# Patient Record
Sex: Female | Born: 1960 | Race: White | Hispanic: No | Marital: Married | Smoking: Never smoker
Health system: Southern US, Community
[De-identification: ages and names within clinical notes are randomized; demographics above are authoritative.]

---
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
## 1987-03-18 HISTORY — PX: OTHER SURGICAL HISTORY: SHX169

## 1998-07-10 ENCOUNTER — Other Ambulatory Visit: Admission: RE | Admit: 1998-07-10 | Discharge: 1998-07-10 | Payer: Self-pay | Admitting: Obstetrics and Gynecology

## 2000-10-20 ENCOUNTER — Other Ambulatory Visit: Admission: RE | Admit: 2000-10-20 | Discharge: 2000-10-20 | Payer: Self-pay | Admitting: Obstetrics and Gynecology

## 2001-10-25 ENCOUNTER — Other Ambulatory Visit: Admission: RE | Admit: 2001-10-25 | Discharge: 2001-10-25 | Payer: Self-pay | Admitting: Obstetrics and Gynecology

## 2002-12-05 ENCOUNTER — Other Ambulatory Visit: Admission: RE | Admit: 2002-12-05 | Discharge: 2002-12-05 | Payer: Self-pay | Admitting: Obstetrics and Gynecology

## 2004-09-05 ENCOUNTER — Encounter: Admission: RE | Admit: 2004-09-05 | Discharge: 2004-09-05 | Payer: Self-pay | Admitting: *Deleted

## 2004-11-19 ENCOUNTER — Ambulatory Visit (HOSPITAL_COMMUNITY): Admission: RE | Admit: 2004-11-19 | Discharge: 2004-11-19 | Payer: Self-pay | Admitting: Orthopedic Surgery

## 2004-11-19 ENCOUNTER — Ambulatory Visit (HOSPITAL_BASED_OUTPATIENT_CLINIC_OR_DEPARTMENT_OTHER): Admission: RE | Admit: 2004-11-19 | Discharge: 2004-11-19 | Payer: Self-pay | Admitting: Orthopedic Surgery

## 2005-03-17 HISTORY — PX: ANKLE SURGERY: SHX546

## 2007-07-01 ENCOUNTER — Encounter: Admission: RE | Admit: 2007-07-01 | Discharge: 2007-07-01 | Payer: Self-pay | Admitting: Internal Medicine

## 2007-12-10 ENCOUNTER — Encounter: Admission: RE | Admit: 2007-12-10 | Discharge: 2007-12-10 | Payer: Self-pay | Admitting: Internal Medicine

## 2008-12-11 ENCOUNTER — Encounter: Admission: RE | Admit: 2008-12-11 | Discharge: 2008-12-11 | Payer: Self-pay | Admitting: Internal Medicine

## 2008-12-19 ENCOUNTER — Ambulatory Visit: Payer: Self-pay | Admitting: Family Medicine

## 2009-06-26 ENCOUNTER — Other Ambulatory Visit: Admission: RE | Admit: 2009-06-26 | Discharge: 2009-06-26 | Payer: Self-pay | Admitting: Internal Medicine

## 2010-08-02 NOTE — Op Note (Signed)
Kaitlyn Morris, Kaitlyn Morris              ACCOUNT NO.:  [id number]   MEDICAL RECORD NO.:  [id number]          PATIENT TYPE:  AMB   LOCATION:  DSC                          FACILITY:  [facility]   PHYSICIAN:  Leonides Grills, M.D.     DATE OF BIRTH:  [birth year]   DATE OF PROCEDURE:  11/19/2004  DATE OF DISCHARGE:                                 OPERATIVE REPORT   PREOPERATIVE DIAGNOSES:  1.  Left Achilles tendinopathy.  2.  Left tight gastrocnemius.   POSTOPERATIVE DIAGNOSES:  1.  Left Achilles tendinopathy.  2.  Left tight gastrocnemius.   OPERATIONS:  1.  Left FHL to calcaneus tendon transfer.  2.  Debridement of Achilles tendon.  3.  Left gastrocnemius slide.   ANESTHESIA:  General.   SURGEON:  Leonides Grills, M.D.   ASSISTANTThomasena Edis, PAC.   ESTIMATED BLOOD LOSS:  Minimal.   TOURNIQUET TIME:  Approximately an hour.   COMPLICATIONS:  None.   DISPOSITION:  Stable to the PARU.   INDICATIONS:  This is a 50 year old female who has had longstanding  posterior ankle pain secondary to Achilles tendinopathy.  It was interfering  with her life __________  She was consented for the above procedure.  All  risks which include infection, neurovascular injury, Achilles tendon  rupture, persistent pain, worsening of pain, prolonged recovery, weakness of  the great toe and push off were all explained.  Questions were encouraged  and answered.   OPERATION:  The patient was brought to the operating room and placed in the  supine position initially.  After adequate general endotracheal tube  anesthesia was administered as well as Ancef 1 gram IV piggyback, the  patient was then placed in the sloppy lateral position, operative side down,  and the left lower extremity was then prepped and draped in a sterile manner  over a proximally placed thigh tourniquet.  We started the procedure with a  longitudinal incision over the gastrocnemius medial muscle/tendinous  junction.  Dissection was carried down  through the skin.  Hemostasis was  obtained.  The fascia was opened in line with the incision.  The conjoined  region was then developed between the gastrocnemius and soleus, and soft  tissue was then elevated off the posterior aspect of the gastrocnemius.  Sural nerve was identified and protected posteriorly.  The gastrocnemius  tendon was then released with the curved Mayo scissors.  This had excellent  release of the tight gastrocnemius.  The area was copiously irrigated with  normal saline.  The subcu was closed with 3-0 Vicryl.  The skin was closed  with 4-0 Monocryl subcuticular stitch.  Steri-Strips were applied.   We then gravity exsanguinated the left lower extremity and tourniquet was  elevated to 290 mmHg.  A longitudinal incision was made in the midline  between the posterior aspect of the posterior tibial tendon and the  anteromedial aspect of the Achilles tendon.  Dissection was carried down  through skin.  Hemostasis was obtained.  The Achilles tendon was then  identified and it was debrided.  The soleus unusually was distalized and  left  approximately 2 to 3 cm of Achilles tendon that was involved in the  tendinosis, which did not have a muscle belly.  We then identified the FHL  tendon through deep dissection, protecting the neurovascular structures  medially, and traced the FHL to the sustentaculum tali and then tenotomized  the tendon as distal as possible.  We then prepared the dorsal aspect of the  calcaneal tuber, placed a guide wire and then drilled with an 8-mm  cannulated drill to about a depth of 30 mm.  We then chose an [employer]  tenodesis bioabsorbable screw measuring 8 x 23 mm.  We then placed this on  the application tool with #2 FiberWire and then placed the #2 FiberWire in  the tip of the FHL tendon.  Pulling on the tendon, the muscle belly  distalized beautifully.  We then applied the lasso around the tip of the FHL  tendon, placed this in the canal, and  then placed the tenodesis screw into  the drill hole.  This had excellent purchase and maintenance of the tension  on the FHL tendon.  The muscle belly was distalized completely down to the  calcaneal tuber and was in excellent apposition against the Achilles tendon.  The FHL tendon was then sewn to the Achilles tendon using #2 FiberWire,  completing the transfer and the repair of the FHL tendon to the Achilles  tendon as well.  Then we sewed the FHL muscle belly epimysium to the  paratenon of the Achilles tendon using 4-0 PDS.  Again, this had an  outstanding repair.  The area was copiously irrigated with normal saline.  Tourniquet was deflated.  Hemostasis was obtained.  There was no pulsatile  bleeding.  Subcu was closed with 3-0 Vicryl.  Skin was closed with 4-0  nylon.  Sterile dressing was applied.  Modified Jones dressing was applied.  The patient was stable to the PARU.      Leonides Grills, M.D.  Electronically Signed     PB/MEDQ  D:  11/19/2004  T:  11/19/2004  Job:  [id number]

## 2011-06-20 ENCOUNTER — Other Ambulatory Visit: Payer: Self-pay | Admitting: Internal Medicine

## 2011-06-20 DIAGNOSIS — Z1231 Encounter for screening mammogram for malignant neoplasm of breast: Secondary | ICD-10-CM

## 2011-06-26 ENCOUNTER — Ambulatory Visit
Admission: RE | Admit: 2011-06-26 | Discharge: 2011-06-26 | Disposition: A | Discharge status: Home / Self Care | Payer: BC Managed Care – PPO | Source: Ambulatory Visit | Attending: Internal Medicine | Admitting: Internal Medicine

## 2011-06-26 DIAGNOSIS — Z1231 Encounter for screening mammogram for malignant neoplasm of breast: Secondary | ICD-10-CM

## 2011-06-30 ENCOUNTER — Other Ambulatory Visit: Payer: Self-pay | Admitting: Internal Medicine

## 2011-06-30 DIAGNOSIS — R928 Other abnormal and inconclusive findings on diagnostic imaging of breast: Secondary | ICD-10-CM

## 2011-07-01 ENCOUNTER — Other Ambulatory Visit: Payer: Self-pay | Admitting: Internal Medicine

## 2011-07-01 ENCOUNTER — Ambulatory Visit
Admission: RE | Admit: 2011-07-01 | Discharge: 2011-07-01 | Disposition: A | Discharge status: Home / Self Care | Payer: BC Managed Care – PPO | Source: Ambulatory Visit | Attending: Internal Medicine | Admitting: Internal Medicine

## 2011-07-01 ENCOUNTER — Other Ambulatory Visit (HOSPITAL_COMMUNITY)
Admission: RE | Admit: 2011-07-01 | Discharge: 2011-07-01 | Disposition: A | Discharge status: Home / Self Care | Payer: BC Managed Care – PPO | Source: Ambulatory Visit | Attending: Internal Medicine | Admitting: Internal Medicine

## 2011-07-01 DIAGNOSIS — Z1159 Encounter for screening for other viral diseases: Secondary | ICD-10-CM | POA: Insufficient documentation

## 2011-07-01 DIAGNOSIS — R928 Other abnormal and inconclusive findings on diagnostic imaging of breast: Secondary | ICD-10-CM

## 2011-07-01 DIAGNOSIS — Z01419 Encounter for gynecological examination (general) (routine) without abnormal findings: Secondary | ICD-10-CM | POA: Insufficient documentation

## 2013-03-23 ENCOUNTER — Other Ambulatory Visit: Payer: Self-pay

## 2013-03-23 DIAGNOSIS — Z1231 Encounter for screening mammogram for malignant neoplasm of breast: Secondary | ICD-10-CM

## 2013-03-29 ENCOUNTER — Ambulatory Visit
Admission: RE | Admit: 2013-03-29 | Discharge: 2013-03-29 | Disposition: A | Discharge status: Home / Self Care | Payer: BC Managed Care – PPO | Source: Ambulatory Visit

## 2013-03-29 DIAGNOSIS — Z1231 Encounter for screening mammogram for malignant neoplasm of breast: Secondary | ICD-10-CM

## 2015-01-11 ENCOUNTER — Other Ambulatory Visit (HOSPITAL_COMMUNITY)
Admission: RE | Admit: 2015-01-11 | Discharge: 2015-01-11 | Disposition: A | Discharge status: Home / Self Care | Payer: BLUE CROSS/BLUE SHIELD | Source: Ambulatory Visit | Attending: Internal Medicine | Admitting: Internal Medicine

## 2015-01-11 ENCOUNTER — Other Ambulatory Visit: Payer: Self-pay | Admitting: Internal Medicine

## 2015-01-11 DIAGNOSIS — Z01419 Encounter for gynecological examination (general) (routine) without abnormal findings: Secondary | ICD-10-CM | POA: Diagnosis present

## 2015-01-11 DIAGNOSIS — Z1151 Encounter for screening for human papillomavirus (HPV): Secondary | ICD-10-CM | POA: Insufficient documentation

## 2015-01-16 LAB — CYTOLOGY - PAP

## 2019-06-03 ENCOUNTER — Ambulatory Visit: Payer: BLUE CROSS/BLUE SHIELD | Attending: Internal Medicine

## 2019-06-03 DIAGNOSIS — Z23 Encounter for immunization: Secondary | ICD-10-CM

## 2019-06-03 NOTE — Progress Notes (Signed)
   Covid-19 Vaccination Clinic  Name:  Kaitlyn Morris    MRN: [id number] DOB: 04-03-1960  06/03/2019  Kaitlyn Morris was observed post Covid-19 immunization for 15 minutes without incident. She was provided with Vaccine Information Sheet and instruction to access the V-Safe system.   Kaitlyn Morris was instructed to call 911 with any severe reactions post vaccine: Marland Kitchen Difficulty breathing  . Swelling of face and throat  . A fast heartbeat  . A bad rash all over body  . Dizziness and weakness   Immunizations Administered    Name Date Dose VIS Date Route   Pfizer COVID-19 Vaccine 06/03/2019 10:42 AM 0.3 mL 02/25/2019 Intramuscular   Manufacturer: [employer], [employer]   Lot: [id number]   NDC: [address]-7

## 2019-06-29 ENCOUNTER — Ambulatory Visit: Payer: BLUE CROSS/BLUE SHIELD | Attending: Internal Medicine

## 2019-06-29 DIAGNOSIS — Z23 Encounter for immunization: Secondary | ICD-10-CM

## 2019-06-29 NOTE — Progress Notes (Signed)
   Covid-19 Vaccination Clinic  Name:  Kaitlyn Morris    MRN: [id number] DOB: 05-Mar-1961  06/29/2019  [name] was observed post Covid-19 immunization for 15 minutes without incident. She was provided with Vaccine Information Sheet and instruction to access the V-Safe system.   [name] was instructed to call 911 with any severe reactions post vaccine: Marland Kitchen Difficulty breathing  . Swelling of face and throat  . A fast heartbeat  . A bad rash all over body  . Dizziness and weakness   Immunizations Administered    Name Date Dose VIS Date Route   Pfizer COVID-19 Vaccine 06/29/2019 10:21 AM 0.3 mL 02/25/2019 Intramuscular   Manufacturer: [employer], [employer]   Lot: [id number]   NDC: [address]-8

## 2020-07-19 ENCOUNTER — Ambulatory Visit (INDEPENDENT_AMBULATORY_CARE_PROVIDER_SITE_OTHER): Payer: Self-pay | Admitting: Physician Assistant

## 2020-07-19 ENCOUNTER — Encounter: Payer: Self-pay | Admitting: Physician Assistant

## 2020-07-19 ENCOUNTER — Other Ambulatory Visit: Payer: Self-pay

## 2020-07-19 VITALS — BP 135/83 | HR 77 | Temp 98.2°F | Ht 63.0 in | Wt 207.3 lb

## 2020-07-19 DIAGNOSIS — Z7689 Persons encountering health services in other specified circumstances: Secondary | ICD-10-CM

## 2020-07-19 DIAGNOSIS — R030 Elevated blood-pressure reading, without diagnosis of hypertension: Secondary | ICD-10-CM

## 2020-07-19 DIAGNOSIS — Z0000 Encounter for general adult medical examination without abnormal findings: Secondary | ICD-10-CM

## 2020-07-19 NOTE — Patient Instructions (Addendum)
New Chapter One Daily Woman's Multivitamin   Preventing Hypertension Hypertension, also called high blood pressure, is when the force of blood pumping through the arteries is too strong. Arteries are blood vessels that carry blood from the heart throughout the body. Often, hypertension does not cause symptoms until blood pressure is very high. It is important to have your blood pressure checked regularly. Diet and lifestyle changes can help you prevent hypertension, and they may make you feel better overall and improve your quality of life. If you already have hypertension, you may control it with diet and lifestyle changes, as well as with medicine. How can this condition affect me? Over time, hypertension can damage the arteries and decrease blood flow to important parts of the body, including the brain, heart, and kidneys. By keeping your blood pressure in a healthy range, you can help prevent complications like heart attack, heart failure, stroke, kidney failure, and vascular dementia. What can increase my risk?  Being an older adult. Older people are more often affected.  Having family members who have had high blood pressure.  Being obese.  Being female. Males are more likely to have high blood pressure.  Drinking too much alcohol or caffeine.  Smoking or using illegal drugs.  Taking certain medicines, such as antidepressants, decongestants, birth control pills, and NSAIDs, such as ibuprofen.  Having thyroid problems.  Having certain tumors. What actions can I take to prevent or manage this condition? Work with your health care provider to make a hypertension prevention plan that works for you. Follow your plan and keep all follow-up visits as told by your health care provider. Diet changes Maintain a healthy diet. This includes:  Eating less salt (sodium). Ask your health care provider how much sodium is safe for you to have. The general recommendation is to have less than 1 tsp  (2,300 mg) of sodium a day. ? Do not add salt to your food. ? Choose low-sodium options when grocery shopping and eating out.  Limiting fats in your diet. You can do this by eating low-fat or fat-free dairy products and by eating less red meat.  Eating more fruits, vegetables, and whole grains. Make a goal to eat: ? 1-2 cups of fresh fruits and vegetables each day. ? 3-4 servings of whole grains each day.  Avoiding foods and beverages that have added sugars.  Eating fish that contain healthy fats (omega-3 fatty acids), such as mackerel or salmon. If you need help putting together a healthy eating plan, try the DASH diet. This diet is high in fruits, vegetables, and whole grains. It is low in sodium, red meat, and added sugars. DASH stands for Dietary Approaches to Stop Hypertension.   Lifestyle changes Lose weight if you are overweight. Losing just 3?5% of your body weight can help prevent or control hypertension. For example, if your present weight is 200 lb (91 kg), a loss of 3-5% of your weight means losing 6-10 lb (2.7-4.5 kg). Ask your health care provider to help you with a diet and exercise plan to safely lose weight. Other recommendations usually include:  Get enough exercise. Do at least 150 minutes of moderate-intensity exercise each week. You could do this in short exercise sessions several times a day, or you could do longer exercise sessions a few times a week. For example, you could take a brisk 10-minute walk or bike ride, 3 times a day, for 5 days a week.  Find ways to reduce stress, such as exercising, meditating,  listening to music, or taking a yoga class. If you need help reducing stress, ask your health care provider.  Do not use any products that contain nicotine or tobacco, such as cigarettes, e-cigarettes, and chewing tobacco. If you need help quitting, ask your health care provider. Chemicals in tobacco and nicotine products raise your blood pressure each time you use  them. If you need help quitting, ask your health care provider.  Learn how to check your blood pressure at home. Make sure that you know your personal target blood pressure, as told by your health care provider.  Try to sleep 7-9 hours per night.   Alcohol use  Do not drink alcohol if: ? Your health care provider tells you not to drink. ? You are pregnant, may be pregnant, or are planning to become pregnant.  If you drink alcohol: ? Limit how much you use to:  0-1 drink a day for women.  0-2 drinks a day for men. ? Be aware of how much alcohol is in your drink. In the [place] one drink equals one 12 oz bottle of beer (355 mL), one 5 oz glass of wine (148 mL), or one 1 oz glass of hard liquor (44 mL). Medicines In addition to diet and lifestyle changes, your health care provider may recommend medicines to help lower your blood pressure. In general:  You may need to try a few different medicines to find what works best for you.  You may need to take more than one medicine.  Take over-the-counter and prescription medicines only as told by your health care provider. Questions to ask your health care provider  What is my blood pressure goal?  How can I lower my risk for high blood pressure?  How should I monitor my blood pressure at home? Where to find support Your health care provider can help you prevent hypertension and help you keep your blood pressure at a healthy level. Your local hospital or your community may also provide support services and prevention programs. The American Heart Association offers an online support network at supportnetwork.heart.org Where to find more information Learn more about hypertension from:  National Heart, Lung, and Blood Institute: PopSteam.is  Centers for Disease Control and Prevention: FootballExhibition.com.br  American Academy of Family Physicians: familydoctor.org Learn more about the DASH diet from:  National Heart, Lung, and Blood  Institute: PopSteam.is Contact a health care provider if:  You think you are having a reaction to medicines you have taken.  You have recurrent headaches or feel dizzy.  You have swelling in your ankles.  You have trouble with your vision. Get help right away if:  You have sudden, severe chest, back, or abdominal pain or discomfort.  You have shortness of breath.  You have a sudden, severe headache. These symptoms may represent a serious problem that is an emergency. Do not wait to see if the symptoms will go away. Get medical help right away. Call your local emergency services (911 in the [place] Do not drive yourself to the hospital.  Summary  Hypertension often does not cause any symptoms until blood pressure is very high. It is important to get your blood pressure checked regularly.  Diet and lifestyle changes are important steps in preventing hypertension.  By keeping your blood pressure in a healthy range, you may prevent complications like heart attack, heart failure, stroke, and kidney failure.  Work with your health care provider to make a hypertension prevention plan that works for you. This information  is not intended to replace advice given to you by your health care provider. Make sure you discuss any questions you have with your health care provider. Document Revised: 02/01/2019 Document Reviewed: 02/01/2019 Elsevier Patient Education  2021 [employer].

## 2020-07-19 NOTE — Progress Notes (Signed)
New Patient Office Visit  Subjective:  Patient ID: Kaitlyn Morris, female    DOB: 10-10-1960  Age: 60 y.o. MRN: [id number]  CC:  Chief Complaint  Patient presents with  . New Patient (Initial Visit)    HPI Kaitlyn Morris presents to establish care. G0P0. Patient reports it has been several years since she has been to a PCP.  No pertinent past medical history.  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  Patient reports is not taking supplements, prescribed or over-the-counter medication.  No pertinent family history.  Patient reports has never been a smoker, does not drink alcohol or use illicit drugs.  Patient drinks about one half-one cup of tea per day. Walks twice daily.  Reports has been under significant stress recently with the sickness of her dog and has been busy with work.  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  Patient denies any prior history of hypertension.  Denies chest pain, palpitations, dizziness, headache or edema.  History reviewed. No pertinent past medical history.  Past Surgical History:  Procedure Laterality Date  . ANKLE SURGERY  2007  . REPLACEMENT TOTAL KNEE Right 1980  . WRIST FUSION  1989    History reviewed. No pertinent family history.  Social History   Socioeconomic History  . Marital status: Married    Spouse name: Halina Asano  . Number of children: Not on file  . Years of education: Not on file  . Highest education level: Not on file  Occupational History  . Not on file  Tobacco Use  . Smoking status: Never Smoker  . Smokeless tobacco: Never Used  Vaping Use  . Vaping Use: Never used  Substance and Sexual Activity  . Alcohol use: Not Currently  . Drug use: Never  . Sexual activity: Yes    Birth control/protection: None  Other Topics Concern  . Not on file  Social History Narrative  . Not on file    Social Determinants of Health   Financial Resource Strain: Not on file  Food Insecurity: Not on file  Transportation Needs: Not on file  Physical Activity: Not on file  Stress: Not on file  Social Connections: Not on file  Intimate Partner Violence: Not on file    ROS Review of Systems A fourteen system review of systems was performed and found to be positive as per HPI.  Objective:   Today's Vitals: BP 135/83   Pulse 77   Temp 98.2 F (36.8 C)   Ht 5\' 3"  (1.6 m)   Wt 207 lb 4.8 oz (94 kg)   SpO2 97%   BMI 36.72 kg/m   Physical Exam General:  Well Developed, well nourished, appropriate for stated age.  Neuro:  Alert and oriented,  extra-ocular muscles intact  HEENT:  Normocephalic, atraumatic, neck supple Skin:  no gross rash, warm, pink. Cardiac:  RRR, S1 S2 wnl's, no murmur  Respiratory:  ECTA B/L w/o wheezing, Not using accessory muscles, speaking in full sentences- unlabored. Vascular:  Ext warm, no cyanosis apprec.; cap RF less 2 sec. Psych:  No HI/SI, judgement and insight good, Euthymic mood. Full Affect.   Assessment & Plan:   Problem List Items Addressed This Visit   None   Visit Diagnoses    Encounter to establish care    -  Primary   Elevated blood pressure reading without diagnosis  of hypertension       Healthcare maintenance         Healthcare Maintenance: -Recommend to schedule CPE and fasting blood work. -Patient reports had a colonoscopy about 8 years ago, ordered by Dr. Kirby Funk.  Will request records. -Recommend to stay hydrated, continue low caffeine consumption and continue walking regimen with ultimate goal of 150 minutes/wk of moderate physical activity.  Elevated blood pressure reading without diagnosis of hypertension: -Initial BP elevated, BP recheck improved and stable. Patient asymptomatic. -Recommend stress reduction techniques. -Will continue to monitor.   -Will repeat blood pressure at follow-up visit.  No outpatient  encounter medications on file as of 07/19/2020.   No facility-administered encounter medications on file as of 07/19/2020.    Follow-up: Return for CPE and FBW few days prior in 2-3 weeks.   Mayer Masker, PA-C

## 2020-07-31 ENCOUNTER — Other Ambulatory Visit: Payer: Self-pay | Admitting: Physician Assistant

## 2020-07-31 DIAGNOSIS — Z0000 Encounter for general adult medical examination without abnormal findings: Secondary | ICD-10-CM

## 2020-08-03 ENCOUNTER — Other Ambulatory Visit: Payer: Self-pay

## 2020-08-03 DIAGNOSIS — Z0000 Encounter for general adult medical examination without abnormal findings: Secondary | ICD-10-CM

## 2020-08-04 LAB — COMPREHENSIVE METABOLIC PANEL
ALT: 18 IU/L (ref 0–32)
AST: 12 IU/L (ref 0–40)
Albumin/Globulin Ratio: 1.4 (ref 1.2–2.2)
Albumin: 4.3 g/dL (ref 3.8–4.9)
Alkaline Phosphatase: 71 IU/L (ref 44–121)
BUN/Creatinine Ratio: 23 (ref 9–23)
BUN: 19 mg/dL (ref 6–24)
Bilirubin Total: 0.3 mg/dL (ref 0.0–1.2)
CO2: 23 mmol/L (ref 20–29)
Calcium: 9.5 mg/dL (ref 8.7–10.2)
Chloride: 100 mmol/L (ref 96–106)
Creatinine, Ser: 0.84 mg/dL (ref 0.57–1.00)
Globulin, Total: 3.1 g/dL (ref 1.5–4.5)
Glucose: 94 mg/dL (ref 65–99)
Potassium: 4.6 mmol/L (ref 3.5–5.2)
Sodium: 138 mmol/L (ref 134–144)
Total Protein: 7.4 g/dL (ref 6.0–8.5)
eGFR: 80 mL/min/{1.73_m2} (ref >59)

## 2020-08-04 LAB — HEMOGLOBIN A1C
Est. average glucose Bld gHb Est-mCnc: 120 mg/dL
Hgb A1c MFr Bld: 5.8 % — ABNORMAL HIGH (ref 4.8–5.6)

## 2020-08-04 LAB — CBC
Hematocrit: 44.8 % (ref 34.0–46.6)
Hemoglobin: 14.5 g/dL (ref 11.1–15.9)
MCH: 29.7 pg (ref 26.6–33.0)
MCHC: 32.4 g/dL (ref 31.5–35.7)
MCV: 92 fL (ref 79–97)
Platelets: 292 10*3/uL (ref 150–450)
RBC: 4.89 x10E6/uL (ref 3.77–5.28)
RDW: 12.9 % (ref 11.7–15.4)
WBC: 6.3 10*3/uL (ref 3.4–10.8)

## 2020-08-04 LAB — LIPID PANEL
Chol/HDL Ratio: 3.5 ratio (ref 0.0–4.4)
Cholesterol, Total: 189 mg/dL (ref 100–199)
HDL: 54 mg/dL (ref >39)
LDL Chol Calc (NIH): 109 mg/dL — ABNORMAL HIGH (ref 0–99)
Triglycerides: 148 mg/dL (ref 0–149)
VLDL Cholesterol Cal: 26 mg/dL (ref 5–40)

## 2020-08-04 LAB — TSH: TSH: 2.14 u[IU]/mL (ref 0.450–4.500)

## 2020-08-07 ENCOUNTER — Encounter: Payer: Self-pay | Admitting: Physician Assistant

## 2020-08-07 ENCOUNTER — Ambulatory Visit (INDEPENDENT_AMBULATORY_CARE_PROVIDER_SITE_OTHER): Payer: Self-pay | Admitting: Physician Assistant

## 2020-08-07 ENCOUNTER — Other Ambulatory Visit: Payer: Self-pay

## 2020-08-07 ENCOUNTER — Other Ambulatory Visit (HOSPITAL_COMMUNITY)
Admission: RE | Admit: 2020-08-07 | Discharge: 2020-08-07 | Disposition: A | Discharge status: Home / Self Care | Payer: BLUE CROSS/BLUE SHIELD | Source: Ambulatory Visit | Attending: Physician Assistant | Admitting: Physician Assistant

## 2020-08-07 VITALS — BP 132/82 | HR 79 | Temp 99.1°F | Ht 63.0 in | Wt 206.7 lb

## 2020-08-07 DIAGNOSIS — Z1231 Encounter for screening mammogram for malignant neoplasm of breast: Secondary | ICD-10-CM

## 2020-08-07 DIAGNOSIS — R7303 Prediabetes: Secondary | ICD-10-CM

## 2020-08-07 DIAGNOSIS — Z124 Encounter for screening for malignant neoplasm of cervix: Secondary | ICD-10-CM | POA: Insufficient documentation

## 2020-08-07 DIAGNOSIS — E7800 Pure hypercholesterolemia, unspecified: Secondary | ICD-10-CM

## 2020-08-07 DIAGNOSIS — Z0000 Encounter for general adult medical examination without abnormal findings: Secondary | ICD-10-CM

## 2020-08-07 DIAGNOSIS — Z6836 Body mass index (BMI) 36.0-36.9, adult: Secondary | ICD-10-CM

## 2020-08-07 DIAGNOSIS — Z1239 Encounter for other screening for malignant neoplasm of breast: Secondary | ICD-10-CM

## 2020-08-07 NOTE — Patient Instructions (Signed)
Preventive Care 60-60 Years Old, Female Preventive care refers to lifestyle choices and visits with your health care provider that can promote health and wellness. This includes:  A yearly physical exam. This is also called an annual wellness visit.  Regular dental and eye exams.  Immunizations.  Screening for certain conditions.  Healthy lifestyle choices, such as: ? Eating a healthy diet. ? Getting regular exercise. ? Not using drugs or products that contain nicotine and tobacco. ? Limiting alcohol use. What can I expect for my preventive care visit? Physical exam Your health care provider will check your:  Height and weight. These may be used to calculate your BMI (body mass index). BMI is a measurement that tells if you are at a healthy weight.  Heart rate and blood pressure.  Body temperature.  Skin for abnormal spots. Counseling Your health care provider may ask you questions about your:  Past medical problems.  Family's medical history.  Alcohol, tobacco, and drug use.  Emotional well-being.  Home life and relationship well-being.  Sexual activity.  Diet, exercise, and sleep habits.  Work and work Statistician.  Access to firearms.  Method of birth control.  Menstrual cycle.  Pregnancy history. What immunizations do I need? Vaccines are usually given at various ages, according to a schedule. Your health care provider will recommend vaccines for you based on your age, medical history, and lifestyle or other factors, such as travel or where you work.   What tests do I need? Blood tests  Lipid and cholesterol levels. These may be checked every 5 years, or more often if you are over 60 years old.  Hepatitis C test.  Hepatitis B test. Screening  Lung cancer screening. You may have this screening every year starting at age 60 if you have a 30-pack-year history of smoking and currently smoke or have quit within the past 15 years.  Colorectal cancer  screening. ? All adults should have this screening starting at age 60 and continuing until age 17. ? Your health care provider may recommend screening at age 60 if you are at increased risk. ? You will have tests every 1-10 years, depending on your results and the type of screening test.  Diabetes screening. ? This is done by checking your blood sugar (glucose) after you have not eaten for a while (fasting). ? You may have this done every 1-3 years.  Mammogram. ? This may be done every 1-2 years. ? Talk with your health care provider about when you should start having regular mammograms. This may depend on whether you have a family history of breast cancer.  BRCA-related cancer screening. This may be done if you have a family history of breast, ovarian, tubal, or peritoneal cancers.  Pelvic exam and Pap test. ? This may be done every 3 years starting at age 10. ? Starting at age 11, this may be done every 5 years if you have a Pap test in combination with an HPV test. Other tests  STD (sexually transmitted disease) testing, if you are at risk.  Bone density scan. This is done to screen for osteoporosis. You may have this scan if you are at high risk for osteoporosis. Talk with your health care provider about your test results, treatment options, and if necessary, the need for more tests. Follow these instructions at home: Eating and drinking  Eat a diet that includes fresh fruits and vegetables, whole grains, lean protein, and low-fat dairy products.  Take vitamin and mineral supplements  as recommended by your health care provider.  Do not drink alcohol if: ? Your health care provider tells you not to drink. ? You are pregnant, may be pregnant, or are planning to become pregnant.  If you drink alcohol: ? Limit how much you have to 0-1 drink a day. ? Be aware of how much alcohol is in your drink. In the [place] one drink equals one 12 oz bottle of beer (355 mL), one 5 oz glass of  wine (148 mL), or one 1 oz glass of hard liquor (44 mL).   Lifestyle  Take daily care of your teeth and gums. Brush your teeth every morning and night with fluoride toothpaste. Floss one time each day.  Stay active. Exercise for at least 30 minutes 5 or more days each week.  Do not use any products that contain nicotine or tobacco, such as cigarettes, e-cigarettes, and chewing tobacco. If you need help quitting, ask your health care provider.  Do not use drugs.  If you are sexually active, practice safe sex. Use a condom or other form of protection to prevent STIs (sexually transmitted infections).  If you do not wish to become pregnant, use a form of birth control. If you plan to become pregnant, see your health care provider for a prepregnancy visit.  If told by your health care provider, take low-dose aspirin daily starting at age 50.  Find healthy ways to cope with stress, such as: ? Meditation, yoga, or listening to music. ? Journaling. ? Talking to a trusted person. ? Spending time with friends and family. Safety  Always wear your seat belt while driving or riding in a vehicle.  Do not drive: ? If you have been drinking alcohol. Do not ride with someone who has been drinking. ? When you are tired or distracted. ? While texting.  Wear a helmet and other protective equipment during sports activities.  If you have firearms in your house, make sure you follow all gun safety procedures. What's next?  Visit your health care provider once a year for an annual wellness visit.  Ask your health care provider how often you should have your eyes and teeth checked.  Stay up to date on all vaccines. This information is not intended to replace advice given to you by your health care provider. Make sure you discuss any questions you have with your health care provider. Document Revised: 12/06/2019 Document Reviewed: 11/12/2017 Elsevier Patient Education  2021 Elsevier Inc.  

## 2020-08-07 NOTE — Progress Notes (Signed)
Subjective:     Kaitlyn Morris is a 60 y.o. female and is here for a comprehensive physical exam. The patient reports no problems.  Social History   Socioeconomic History  . Marital status: Married    Spouse name: Jing Howatt  . Number of children: Not on file  . Years of education: Not on file  . Highest education level: Not on file  Occupational History  . Not on file  Tobacco Use  . Smoking status: Never Smoker  . Smokeless tobacco: Never Used  Vaping Use  . Vaping Use: Never used  Substance and Sexual Activity  . Alcohol use: Not Currently  . Drug use: Never  . Sexual activity: Yes    Birth control/protection: None  Other Topics Concern  . Not on file  Social History Narrative  . Not on file   Social Determinants of Health   Financial Resource Strain: Not on file  Food Insecurity: Not on file  Transportation Needs: Not on file  Physical Activity: Not on file  Stress: Not on file  Social Connections: Not on file  Intimate Partner Violence: Not on file   Health Maintenance  Topic Date Due  . HIV Screening  Never done  . Hepatitis C Screening  Never done  . TETANUS/TDAP  Never done  . COLONOSCOPY (Pts 45-92yrs Insurance coverage will need to be confirmed)  Never done  . Zoster Vaccines- Shingrix (1 of 2) Never done  . MAMMOGRAM  03/30/2015  . INFLUENZA VACCINE  10/15/2020  . PAP SMEAR-Modifier  08/08/2023  . COVID-19 Vaccine  Completed  . HPV VACCINES  Aged Out    The following portions of the patient's history were reviewed and updated as appropriate: allergies, current medications, past family history, past medical history, past social history, past surgical history and problem list.  Review of Systems Pertinent items noted in HPI and remainder of comprehensive ROS otherwise negative.   Objective:    BP 132/82   Pulse 79   Temp 99.1 F (37.3 C)   Ht 5\' 3"  (1.6 m)   Wt 206 lb 11.2 oz (93.8 kg)   SpO2 98%   BMI 36.62 kg/m  General appearance:  alert, cooperative and no distress Head: Normocephalic, without obvious abnormality, atraumatic Eyes: conjunctivae/corneas clear. PERRL, EOM's intact. Fundi benign. Ears: normal TM's and external ear canals both ears Nose: Nares normal. Septum midline. Mucosa normal. No drainage or sinus tenderness. Throat: lips, mucosa, and tongue normal; teeth and gums normal Neck: no adenopathy, no carotid bruit, no JVD, supple, symmetrical, trachea midline and thyroid not enlarged, symmetric, no tenderness/mass/nodules Back: symmetric, no curvature. ROM normal. No CVA tenderness. Lungs: clear to auscultation bilaterally Breasts: normal appearance, no masses or tenderness, dense breast tissue noted Heart: regular rate and rhythm, S1, S2 normal, no murmur, click, rub or gallop Abdomen: soft, non-tender; bowel sounds normal; no masses,  no organomegaly Pelvic: cervix normal in appearance, external genitalia normal, no adnexal masses or tenderness, no cervical motion tenderness, positive findings: vaginal atrophy, uterus normal size, shape, and consistency and vagina normal without discharge Extremities: extremities normal, atraumatic, no cyanosis or edema Pulses: 2+ and symmetric Skin: Skin color, texture, turgor normal. No rashes or lesions Lymph nodes: Cervical, supraclavicular, and axillary nodes normal. Neurologic: Grossly normal    Assessment:    Healthy female exam.     Plan:  -Will place order for mammogram.  Will notify of Pap results once available. -Patient declined colonoscopy, hep C and HIV screenings and  immunizations. -Discussed most recent lab results which are essentially within normal limits with the exception of A1c and lipid panel which are mildly elevated.  Recommend to follow a heart healthy diet, reduce carbohydrates and glucose intake and increase physical activity.  Encouraged weight loss with dietary and lifestyle changes. -BMI 36.62: Associated with prediabetes and  dyslipidemia. -Stay well-hydrated. -Follow-up in 6 months for pre-DM, elevated LDL and FBW (lipid panel, CMP, A1c) few days prior.   See After Visit Summary for Counseling Recommendations

## 2020-08-09 LAB — CYTOLOGY - PAP
Comment: NEGATIVE
Diagnosis: NEGATIVE
High risk HPV: NEGATIVE

## 2020-10-30 ENCOUNTER — Other Ambulatory Visit: Payer: Self-pay

## 2020-10-30 ENCOUNTER — Ambulatory Visit
Admission: RE | Admit: 2020-10-30 | Discharge: 2020-10-30 | Disposition: A | Discharge status: Home / Self Care | Payer: No Typology Code available for payment source | Source: Ambulatory Visit | Attending: Physician Assistant | Admitting: Physician Assistant

## 2020-10-30 ENCOUNTER — Other Ambulatory Visit: Payer: Self-pay | Admitting: Physician Assistant

## 2020-10-30 DIAGNOSIS — Z1231 Encounter for screening mammogram for malignant neoplasm of breast: Secondary | ICD-10-CM

## 2021-02-04 ENCOUNTER — Other Ambulatory Visit: Payer: BLUE CROSS/BLUE SHIELD

## 2021-02-06 ENCOUNTER — Ambulatory Visit: Payer: BLUE CROSS/BLUE SHIELD | Admitting: Physician Assistant

## 2022-08-20 ENCOUNTER — Other Ambulatory Visit: Payer: No Typology Code available for payment source

## 2022-08-20 DIAGNOSIS — Z0000 Encounter for general adult medical examination without abnormal findings: Secondary | ICD-10-CM

## 2022-08-20 DIAGNOSIS — I159 Secondary hypertension, unspecified: Secondary | ICD-10-CM

## 2022-08-21 LAB — CBC WITH DIFFERENTIAL/PLATELET
Basophils Absolute: 0 10*3/uL (ref 0.0–0.2)
Basos: 0 %
EOS (ABSOLUTE): 0.2 10*3/uL (ref 0.0–0.4)
Eos: 2 %
Hematocrit: 45.6 % (ref 34.0–46.6)
Hemoglobin: 15.1 g/dL (ref 11.1–15.9)
Immature Grans (Abs): 0 10*3/uL (ref 0.0–0.1)
Immature Granulocytes: 0 %
Lymphocytes Absolute: 2.4 10*3/uL (ref 0.7–3.1)
Lymphs: 33 %
MCH: 29 pg (ref 26.6–33.0)
MCHC: 33.1 g/dL (ref 31.5–35.7)
MCV: 88 fL (ref 79–97)
Monocytes Absolute: 0.5 10*3/uL (ref 0.1–0.9)
Monocytes: 7 %
Neutrophils Absolute: 4.1 10*3/uL (ref 1.4–7.0)
Neutrophils: 58 %
Platelets: 304 10*3/uL (ref 150–450)
RBC: 5.21 x10E6/uL (ref 3.77–5.28)
RDW: 13.6 % (ref 11.7–15.4)
WBC: 7.2 10*3/uL (ref 3.4–10.8)

## 2022-08-21 LAB — HEMOGLOBIN A1C
Est. average glucose Bld gHb Est-mCnc: 131 mg/dL
Hgb A1c MFr Bld: 6.2 % — ABNORMAL HIGH (ref 4.8–5.6)

## 2022-08-21 LAB — TSH: TSH: 2.17 u[IU]/mL (ref 0.450–4.500)

## 2022-08-21 LAB — COMPREHENSIVE METABOLIC PANEL
ALT: 28 IU/L (ref 0–32)
AST: 16 IU/L (ref 0–40)
Albumin/Globulin Ratio: 1.5 (ref 1.2–2.2)
Albumin: 4.6 g/dL (ref 3.9–4.9)
Alkaline Phosphatase: 85 IU/L (ref 44–121)
BUN/Creatinine Ratio: 20 (ref 12–28)
BUN: 18 mg/dL (ref 8–27)
Bilirubin Total: 0.4 mg/dL (ref 0.0–1.2)
CO2: 19 mmol/L — ABNORMAL LOW (ref 20–29)
Calcium: 9.3 mg/dL (ref 8.7–10.3)
Chloride: 104 mmol/L (ref 96–106)
Creatinine, Ser: 0.89 mg/dL (ref 0.57–1.00)
Globulin, Total: 3 g/dL (ref 1.5–4.5)
Glucose: 100 mg/dL — ABNORMAL HIGH (ref 70–99)
Potassium: 4.8 mmol/L (ref 3.5–5.2)
Sodium: 140 mmol/L (ref 134–144)
Total Protein: 7.6 g/dL (ref 6.0–8.5)
eGFR: 74 mL/min/{1.73_m2} (ref >59)

## 2022-08-21 LAB — LIPID PANEL
Chol/HDL Ratio: 4.2 ratio (ref 0.0–4.4)
Cholesterol, Total: 218 mg/dL — ABNORMAL HIGH (ref 100–199)
HDL: 52 mg/dL (ref >39)
LDL Chol Calc (NIH): 118 mg/dL — ABNORMAL HIGH (ref 0–99)
Triglycerides: 278 mg/dL — ABNORMAL HIGH (ref 0–149)
VLDL Cholesterol Cal: 48 mg/dL — ABNORMAL HIGH (ref 5–40)

## 2022-08-27 ENCOUNTER — Encounter: Payer: Self-pay | Admitting: Family Medicine

## 2022-08-27 ENCOUNTER — Ambulatory Visit (INDEPENDENT_AMBULATORY_CARE_PROVIDER_SITE_OTHER): Payer: No Typology Code available for payment source | Admitting: Family Medicine

## 2022-08-27 VITALS — BP 148/93 | HR 94 | Resp 18 | Ht 63.0 in | Wt 216.0 lb

## 2022-08-27 DIAGNOSIS — Z23 Encounter for immunization: Secondary | ICD-10-CM | POA: Diagnosis not present

## 2022-08-27 DIAGNOSIS — Z1231 Encounter for screening mammogram for malignant neoplasm of breast: Secondary | ICD-10-CM

## 2022-08-27 DIAGNOSIS — Z0000 Encounter for general adult medical examination without abnormal findings: Secondary | ICD-10-CM

## 2022-08-27 DIAGNOSIS — Z1211 Encounter for screening for malignant neoplasm of colon: Secondary | ICD-10-CM

## 2022-08-27 NOTE — Patient Instructions (Addendum)
The Obesity Guide with Hilbert Bible is a fantastic podcast that talks about weight management while living a full life! She is truly amazing.  Let me know if you do ever want to see a nutritionist, [employer], or meet with me for weight management. You can send me a message on MyChart.  You will be due for your mammogram in August.   AT THE PHARMACY: Let them know that you need your Shingles shots.

## 2022-08-27 NOTE — Addendum Note (Signed)
Addended by: Saralyn Pilar on: 08/27/2022 04:21 PM   Modules accepted: Orders

## 2022-08-27 NOTE — Progress Notes (Addendum)
Complete physical exam  Patient: Kaitlyn Morris   DOB: May 26, 1960   62 y.o. Female  MRN: [id number]  Subjective:    Chief Complaint  Patient presents with   Annual Exam    Kaitlyn Morris is a 62 y.o. female who presents today for a complete physical exam. She reports consuming a general diet.  She likes to walk to stay active, but she does want to do so more often.  She generally feels well. She reports sleeping fairly well, however she does still struggle with maintaining a consistent sleep schedule after many years of taking care of her aging dogs. She also would like to discuss weight management as she has really struggled to lose weight since she turned about 62 years old.  Most recent fall risk assessment:    08/27/2022    1:12 PM  Fall Risk   Falls in the past year? 0  Number falls in past yr: 0  Injury with Fall? 0  Risk for fall due to : No Fall Risks  Follow up Falls evaluation completed     Most recent depression and anxiety screenings:    08/27/2022    1:11 PM 08/07/2020    3:20 PM  PHQ 2/9 Scores  PHQ - 2 Score 0 0  PHQ- 9 Score 4 2      08/27/2022    1:11 PM 08/07/2020    3:20 PM  GAD 7 : Generalized Anxiety Score  Nervous, Anxious, on Edge 0 0  Control/stop worrying 1 0  Worry too much - different things 1 0  Trouble relaxing 0 1  Restless 0 0  Easily annoyed or irritable 0 0  Afraid - awful might happen 0 0  Total GAD 7 Score 2 1  Anxiety Difficulty Not difficult at all     There are no problems to display for this patient.   Past Surgical History:  Procedure Laterality Date   ANKLE SURGERY  2007   REPLACEMENT TOTAL KNEE Right 1980   WRIST FUSION  1989   Social History   Tobacco Use   Smoking status: Never    Passive exposure: Never   Smokeless tobacco: Never  Vaping Use   Vaping Use: Never used  Substance Use Topics   Alcohol use: Not Currently   Drug use: Never   History reviewed. No pertinent family history. Allergies  Allergen  Reactions   Aspirin Rash   Penicillins Rash     Patient Care Team: Melida Quitter, PA as PCP - General (Family Medicine)   No outpatient medications prior to visit.   No facility-administered medications prior to visit.    Review of Systems  Constitutional:  Negative for chills, fever and malaise/fatigue.  HENT:  Negative for congestion and hearing loss.   Eyes:  Negative for blurred vision and double vision.  Respiratory:  Negative for cough and shortness of breath.   Cardiovascular:  Negative for chest pain, palpitations and leg swelling.  Gastrointestinal:  Negative for abdominal pain, constipation, diarrhea and heartburn.  Genitourinary:  Negative for frequency and urgency.  Musculoskeletal:  Negative for myalgias and neck pain.  Neurological:  Negative for headaches.  Endo/Heme/Allergies:  Negative for polydipsia.  Psychiatric/Behavioral:  Negative for depression. The patient is not nervous/anxious.       Objective:    BP (!) 148/93 (BP Location: Left Arm, Patient Position: Sitting, Cuff Size: Large)   Pulse 94   Resp 18   Ht 5\' 3"  (1.6 m)  Wt 216 lb (98 kg)   SpO2 95%   BMI 38.26 kg/m    Physical Exam Constitutional:      General: She is not in acute distress.    Appearance: Normal appearance.  HENT:     Head: Normocephalic and atraumatic.     Right Ear: Ear canal normal. There is impacted cerumen.     Left Ear: Tympanic membrane, ear canal and external ear normal. There is no impacted cerumen.     Nose: Nose normal.     Mouth/Throat:     Mouth: Mucous membranes are moist.     Pharynx: No oropharyngeal exudate or posterior oropharyngeal erythema.  Eyes:     Extraocular Movements: Extraocular movements intact.     Conjunctiva/sclera: Conjunctivae normal.     Pupils: Pupils are equal, round, and reactive to light.  Neck:     Thyroid: No thyroid mass, thyromegaly or thyroid tenderness.  Cardiovascular:     Rate and Rhythm: Normal rate and regular  rhythm.     Heart sounds: Normal heart sounds.  Pulmonary:     Effort: Pulmonary effort is normal.     Breath sounds: Normal breath sounds.  Abdominal:     General: Abdomen is flat. Bowel sounds are normal.     Tenderness: There is no abdominal tenderness. There is no guarding or rebound.  Musculoskeletal:        General: No swelling. Normal range of motion.     Cervical back: Normal range of motion and neck supple.  Lymphadenopathy:     Cervical: No cervical adenopathy.  Skin:    General: Skin is warm and dry.  Neurological:     Mental Status: She is alert and oriented to person, place, and time.     Cranial Nerves: No cranial nerve deficit.     Motor: No weakness.     Deep Tendon Reflexes: Reflexes normal.  Psychiatric:        Mood and Affect: Mood normal.       Assessment & Plan:    Routine Health Maintenance and Physical Exam  Immunization History  Administered Date(s) Administered   PFIZER(Purple Top)SARS-COV-2 Vaccination 06/03/2019, 06/29/2019, 12/30/2019, 06/15/2020   Tdap 08/27/2022    Health Maintenance  Topic Date Due   HIV Screening  Never done   Hepatitis C Screening  Never done   Colonoscopy  Never done   Zoster Vaccines- Shingrix (1 of 2) Never done   COVID-19 Vaccine (5 - 2023-24 season) 11/15/2021   INFLUENZA VACCINE  10/16/2022   MAMMOGRAM  10/31/2022   PAP SMEAR-Modifier  08/07/2025   DTaP/Tdap/Td (2 - Td or Tdap) 08/26/2032   HPV VACCINES  Aged Out   Discussed recent lab work which was within normal limits/stable for the most part.  LDL did increase moderately and is now 118, A1c also increased and is now 6.2.  Patient opted for Cologuard for colorectal screening.  She will be due for mammogram in about 2 months.  She is agreeable to getting tetanus booster in office today.  We discussed getting her shingles shots at her local pharmacy.  Discussed health benefits of physical activity, and encouraged her to engage in regular exercise appropriate  for her age and condition.  Wellness examination  Screening for colorectal cancer -     Cologuard  Need for Tdap vaccination -     Tdap vaccine greater than or equal to 7yo IM  Screening mammogram for breast cancer -     Digital Screening  Mammogram, Left and Right; Future  Blood pressure remains elevated on recheck at 148/93.  If blood pressure remains elevated at another appointment, she does meet the criteria for diagnosis of hypertension.  Return in about 1 year (around 08/27/2023) for annual physical, fasting blood work 1 week before.     Melida Quitter, PA

## 2022-10-09 ENCOUNTER — Encounter: Payer: Self-pay | Admitting: Family Medicine

## 2022-11-04 LAB — HM MAMMOGRAPHY

## 2022-11-10 ENCOUNTER — Encounter: Payer: Self-pay | Admitting: Family Medicine

## 2023-02-13 IMAGING — MG MM DIGITAL SCREENING BILAT W/ TOMO AND CAD
8 series · 8 of 24 positions shown · non-contrast
Comparison: Previous exam(s).

CLINICAL DATA: Screening.

EXAM:
DIGITAL SCREENING BILATERAL MAMMOGRAM WITH TOMOSYNTHESIS AND CAD
TECHNIQUE: Bilateral screening digital craniocaudal and mediolateral oblique
mammograms were obtained. Bilateral screening digital breast
tomosynthesis was performed. The images were evaluated with
computer-aided detection.

[L MLO synth-2D]
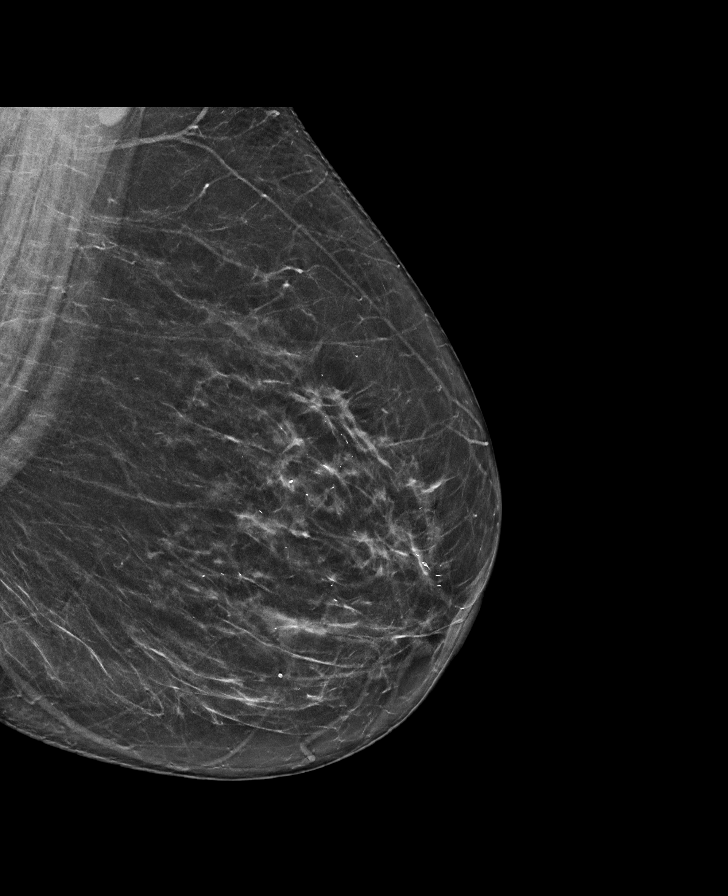

[R CC synth-2D]
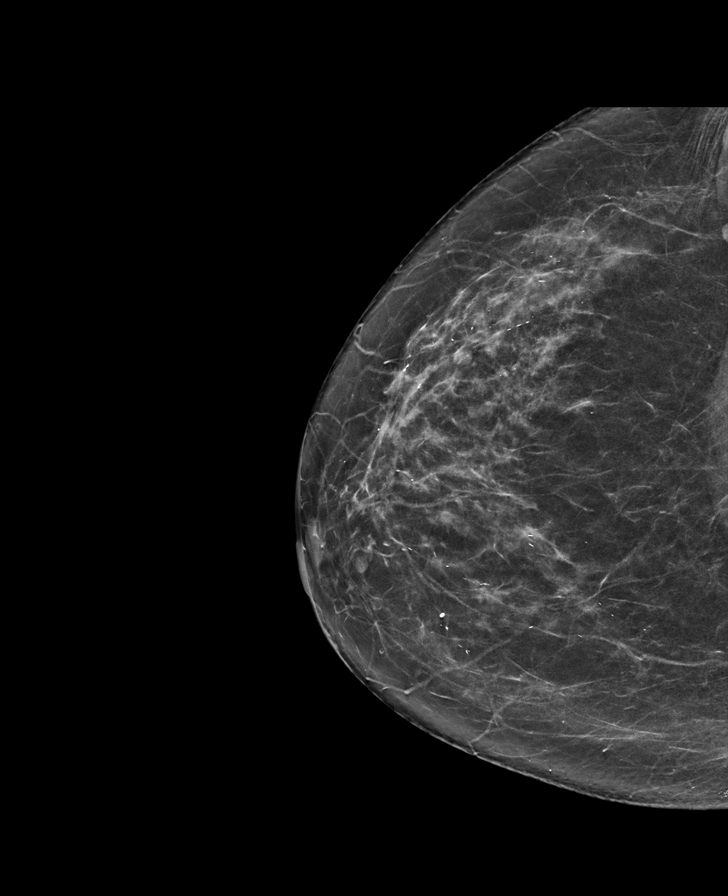

[L CC synth-2D]
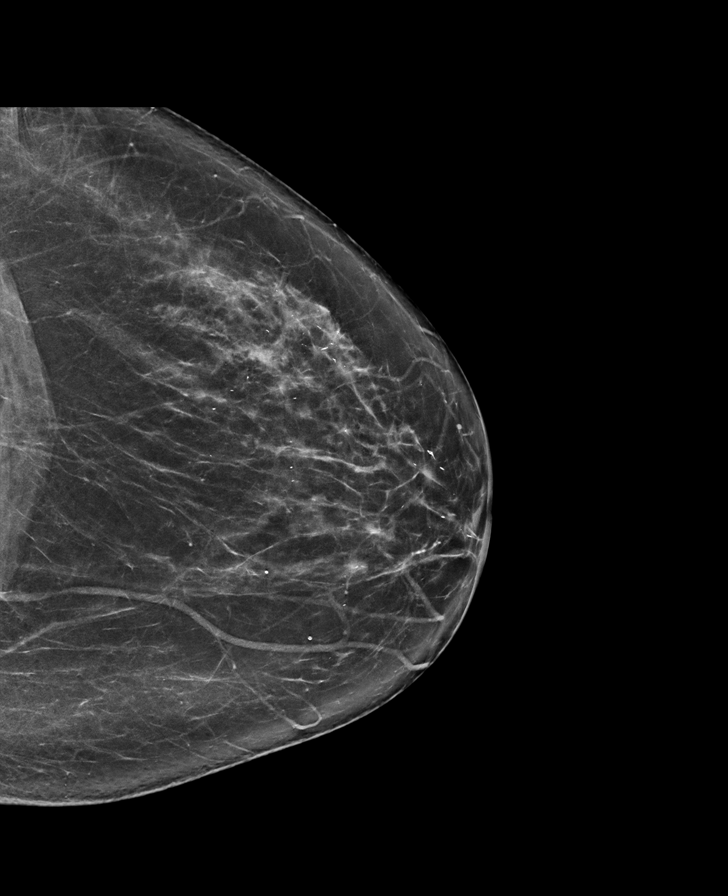

[R MLO synth-2D]
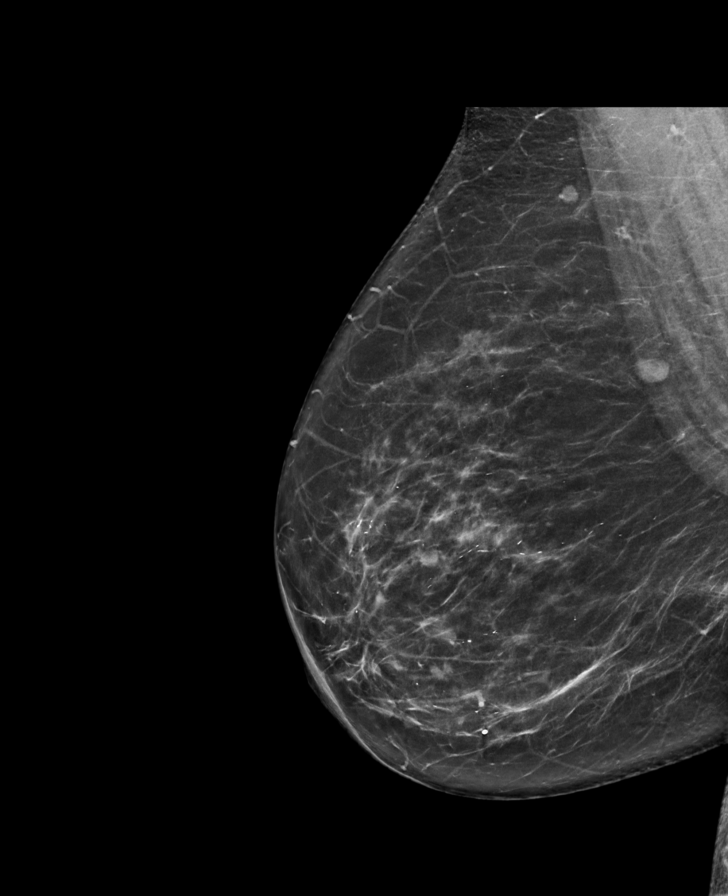

[R MLO tomo · tomo slice 41/81.0]
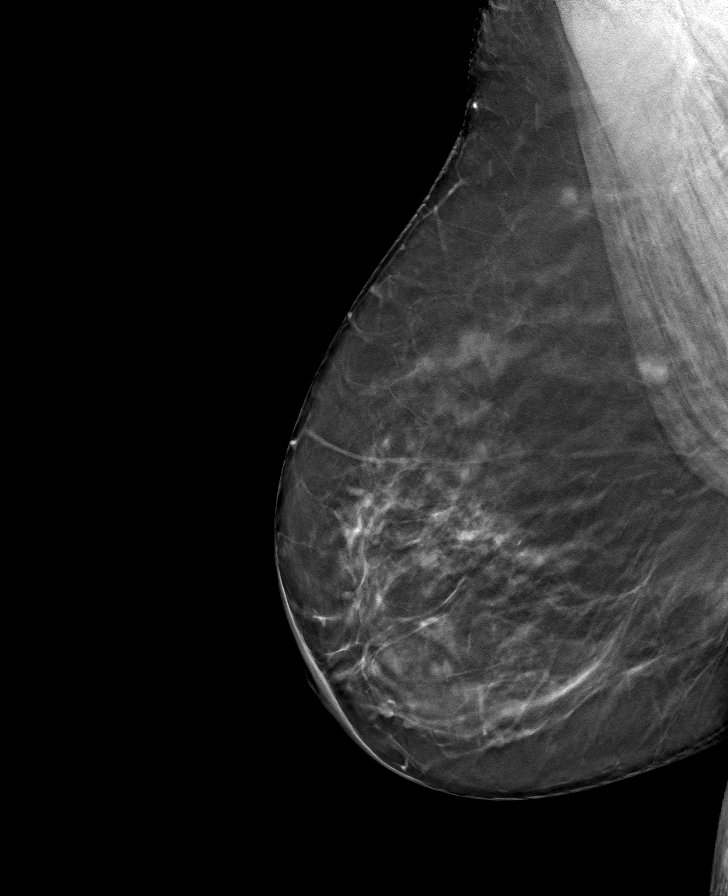

[L CC tomo · tomo slice 39/78.0]
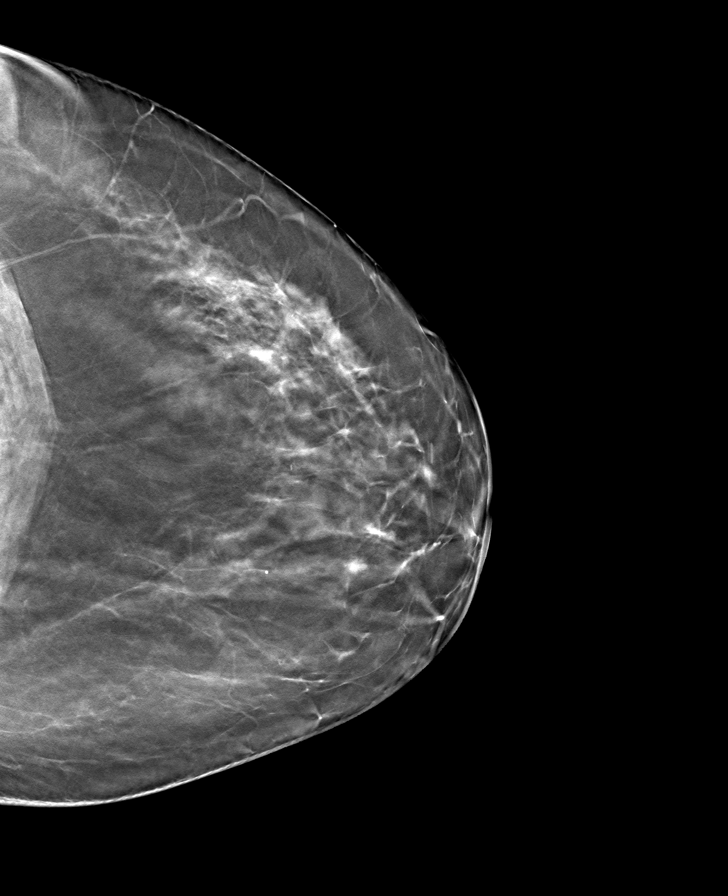

[L MLO tomo · tomo slice 39/76.0]
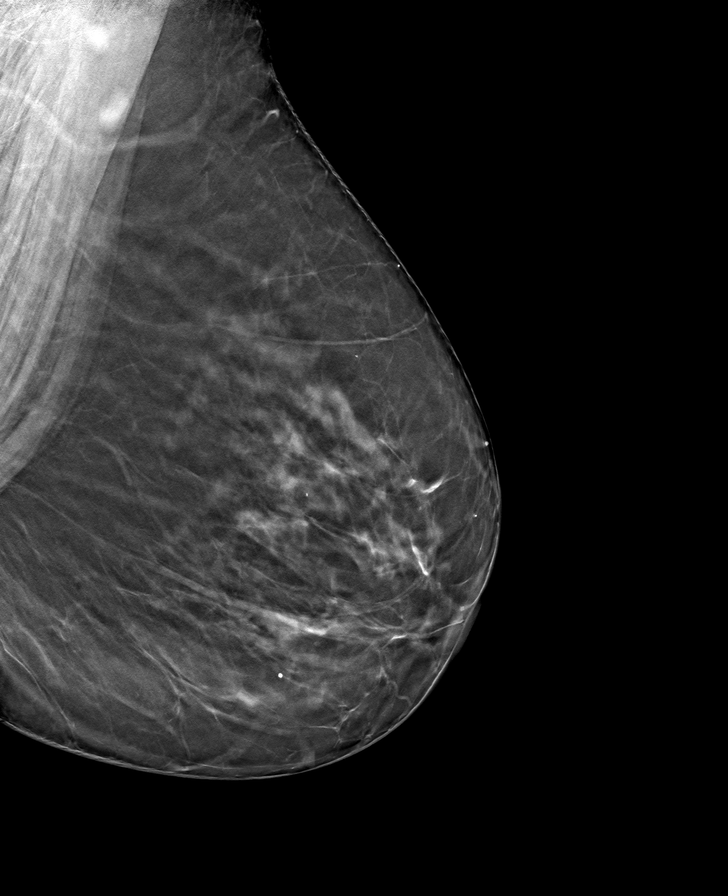

[R CC tomo · tomo slice 37/73.0]
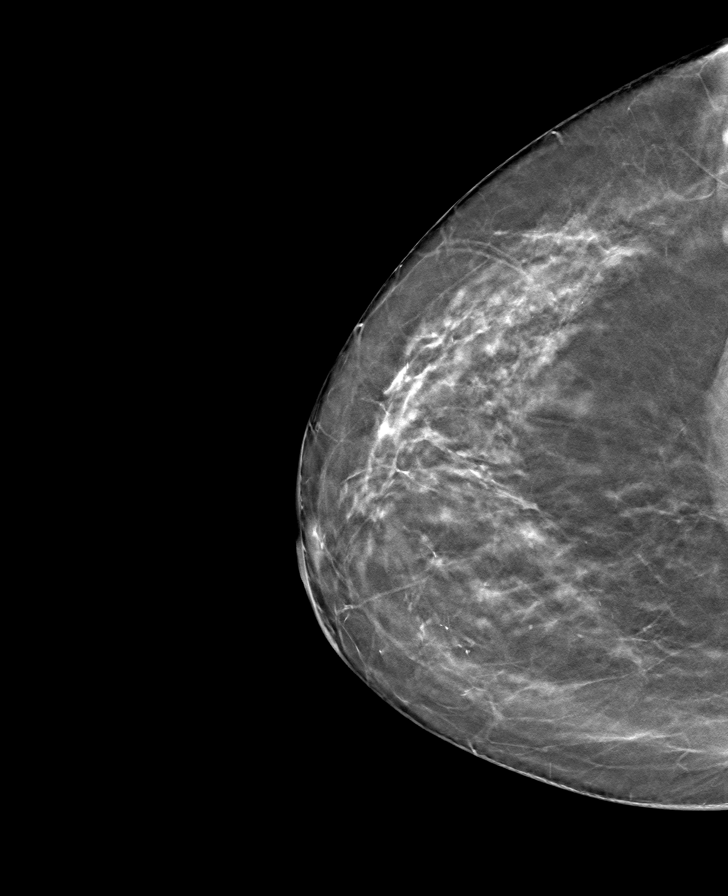

[8 of 24 positions shown; findings below may reference images not displayed]

ACR Breast Density Category b: There are scattered areas of
fibroglandular density.
FINDINGS: There are no findings suspicious for malignancy.
IMPRESSION: No mammographic evidence of malignancy. A result letter of this
screening mammogram will be mailed directly to the patient.

RECOMMENDATION:
Screening mammogram in one year. (Code:51-O-LD2)

BI-RADS CATEGORY  1: Negative.

## 2023-04-23 ENCOUNTER — Encounter: Payer: Self-pay | Admitting: Family Medicine

## 2023-05-27 ENCOUNTER — Ambulatory Visit (INDEPENDENT_AMBULATORY_CARE_PROVIDER_SITE_OTHER): Payer: No Typology Code available for payment source | Admitting: Family Medicine

## 2023-05-27 ENCOUNTER — Encounter: Payer: Self-pay | Admitting: Family Medicine

## 2023-05-27 VITALS — BP 127/80 | HR 82 | Ht 63.0 in | Wt 212.8 lb

## 2023-05-27 DIAGNOSIS — Z0000 Encounter for general adult medical examination without abnormal findings: Secondary | ICD-10-CM

## 2023-05-27 DIAGNOSIS — Z6837 Body mass index (BMI) 37.0-37.9, adult: Secondary | ICD-10-CM | POA: Insufficient documentation

## 2023-05-27 DIAGNOSIS — E785 Hyperlipidemia, unspecified: Secondary | ICD-10-CM | POA: Insufficient documentation

## 2023-05-27 DIAGNOSIS — R7303 Prediabetes: Secondary | ICD-10-CM | POA: Insufficient documentation

## 2023-05-27 NOTE — Patient Instructions (Signed)
 It was nice to see you today,  We addressed the following topics today: -We will get some labs today.  I will let you know the results when I get them. - If you feel like you are having trouble losing weight I would recommend using a phone app or keeping a food diary to track your total caloric intake for a week.  Then using your average daily caloric intake you can subtract about 500 cal from that per day to try for a goal of 1 pound of weight loss per week. - We can also send in a referral to healthy weight and wellness to further help with meal planning and diet.  Have a great day,  Frederic Jericho, MD

## 2023-05-27 NOTE — Assessment & Plan Note (Signed)
 Repeating today.  Elevated a year ago.  Overall ASCVD score is less than 7.5.

## 2023-05-27 NOTE — Progress Notes (Signed)
   Established Patient Office Visit  Subjective   Patient ID: Kaitlyn Morris, female    DOB: 1961/01/16  Age: 63 y.o. MRN: [id number]  Chief Complaint  Patient presents with   Establish Care    HPI  Patient here for annual physical.  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  Has been doing this for her over 10 years now.  Is married.  Does not use alcohol or tobacco.  We discussed patient's labs from last time showing prediabetes, hyperlipidemia.  She is trying to lose weight and sees a nutritionist online.  She is limiting her snacks and liquid calories.  Tries to eat more fresh vegetables.  Is exercising by walking but had to stop at 1 point due to Achilles tendinitis.  She has replaced 1 meal a day with the protein shake.  She walks 5 days a week.  Patient has a referral for Cologuard.  Had a colonoscopy over 10 years ago that was normal with no polyps.    The 10-year ASCVD risk score (Arnett DK, et al., 2019) is: 4.4%  Health Maintenance Due  Topic Date Due   HIV Screening  Never done   Hepatitis C Screening  Never done   Colonoscopy  Never done   Zoster Vaccines- Shingrix (1 of 2) Never done   COVID-19 Vaccine (5 - 2024-25 season) 11/16/2022      Objective:     BP 127/80   Pulse 82   Ht 5\' 3"  (1.6 m)   Wt 212 lb 12.8 oz (96.5 kg)   SpO2 98%   BMI 37.70 kg/m    Physical Exam General: Alert, oriented H&T: PERRLA, EOMI, moist mucosa CV: Regular rhythm Pulmonary: Scar bilaterally GI: Soft, nontender.  Normal bowel sounds Extremities: No pedal edema MSK: Strength equal bilaterally   No results found for any visits on 05/27/23.      Assessment & Plan:   Physical exam, annual  Prediabetes Assessment & Plan: 6.2 1-year ago.  Rechecking today.  Orders: -     Hemoglobin A1c  Hyperlipidemia, unspecified hyperlipidemia type Assessment & Plan: Repeating today.  Elevated a year ago.  Overall ASCVD score is less than 7.5.  Orders: -     Lipid  panel  BMI 37.0-37.9, adult Assessment & Plan: Talks to a virtual nutritionist at [facility].  Is trying to eat healthier.  Is exercising 5 days a week by walking.  We discussed calorie counting.  Offered referral to healthy weight and wellness if she continues to have issues with successful weight loss.  Orders: -     Comprehensive metabolic panel     Return in about 1 year (around 05/26/2024) for physical.    Sandre Kitty, MD

## 2023-05-27 NOTE — Assessment & Plan Note (Signed)
 Talks to a Pharmacologist at [facility] [facility].  Is trying to eat healthier.  Is exercising 5 days a week by walking.  We discussed calorie counting.  Offered referral to healthy weight and wellness if she continues to have issues with successful weight loss.

## 2023-05-27 NOTE — Assessment & Plan Note (Signed)
 6.2 1-year ago.  Rechecking today.

## 2023-05-28 ENCOUNTER — Encounter: Payer: Self-pay | Admitting: Family Medicine

## 2023-05-28 ENCOUNTER — Other Ambulatory Visit: Payer: Self-pay | Admitting: Family Medicine

## 2023-05-28 DIAGNOSIS — E878 Other disorders of electrolyte and fluid balance, not elsewhere classified: Secondary | ICD-10-CM

## 2023-05-28 LAB — COMPREHENSIVE METABOLIC PANEL
ALT: 20 IU/L (ref 0–32)
AST: 16 IU/L (ref 0–40)
Albumin: 4.6 g/dL (ref 3.9–4.9)
Alkaline Phosphatase: 80 IU/L (ref 44–121)
BUN/Creatinine Ratio: 18 (ref 12–28)
BUN: 14 mg/dL (ref 8–27)
Bilirubin Total: 0.3 mg/dL (ref 0.0–1.2)
CO2: 18 mmol/L — ABNORMAL LOW (ref 20–29)
Calcium: 9.2 mg/dL (ref 8.7–10.3)
Chloride: 104 mmol/L (ref 96–106)
Creatinine, Ser: 0.78 mg/dL (ref 0.57–1.00)
Globulin, Total: 2.7 g/dL (ref 1.5–4.5)
Glucose: 103 mg/dL — ABNORMAL HIGH (ref 70–99)
Potassium: 4.8 mmol/L (ref 3.5–5.2)
Sodium: 140 mmol/L (ref 134–144)
Total Protein: 7.3 g/dL (ref 6.0–8.5)
eGFR: 86 mL/min/{1.73_m2} (ref >59)

## 2023-05-28 LAB — LIPID PANEL
Chol/HDL Ratio: 3.8 ratio (ref 0.0–4.4)
Cholesterol, Total: 204 mg/dL — ABNORMAL HIGH (ref 100–199)
HDL: 54 mg/dL (ref >39)
LDL Chol Calc (NIH): 126 mg/dL — ABNORMAL HIGH (ref 0–99)
Triglycerides: 136 mg/dL (ref 0–149)
VLDL Cholesterol Cal: 24 mg/dL (ref 5–40)

## 2023-05-28 LAB — HEMOGLOBIN A1C
Est. average glucose Bld gHb Est-mCnc: 131 mg/dL
Hgb A1c MFr Bld: 6.2 % — ABNORMAL HIGH (ref 4.8–5.6)

## 2023-05-28 NOTE — Progress Notes (Signed)
 Called [facility] the tech stated that a gray top tube needed to be added prior to the labs that were drawn yesterday and order for the Beta to be added that would have needed to be frozen.

## 2023-05-28 NOTE — Addendum Note (Signed)
 Addended by: Sandre Kitty on: 05/28/2023 09:14 AM   Modules accepted: Orders

## 2023-08-21 ENCOUNTER — Other Ambulatory Visit: Payer: No Typology Code available for payment source

## 2023-08-28 ENCOUNTER — Encounter: Payer: No Typology Code available for payment source | Admitting: Family Medicine

## 2024-05-23 ENCOUNTER — Other Ambulatory Visit

## 2024-05-30 ENCOUNTER — Encounter: Admitting: Family Medicine
